# Patient Record
Sex: Female | Born: 1995 | Hispanic: Yes | Marital: Single | Smoking: Never smoker
Health system: Southern US, Community
[De-identification: ages and names within clinical notes are randomized; demographics above are authoritative.]

## PROBLEM LIST (undated history)

## (undated) DIAGNOSIS — Z789 Other specified health status: Secondary | ICD-10-CM

## (undated) DIAGNOSIS — R519 Headache, unspecified: Secondary | ICD-10-CM

## (undated) HISTORY — DX: Other specified health status: Z78.9

## (undated) HISTORY — PX: NO PAST SURGERIES: SHX2092

## (undated) HISTORY — DX: Headache, unspecified: R51.9

---

## 2021-05-23 ENCOUNTER — Other Ambulatory Visit: Payer: Self-pay

## 2021-05-23 ENCOUNTER — Ambulatory Visit (LOCAL_COMMUNITY_HEALTH_CENTER): Payer: Self-pay

## 2021-05-23 VITALS — BP 111/69 | Ht 65.75 in | Wt 162.5 lb

## 2021-05-23 DIAGNOSIS — Z3201 Encounter for pregnancy test, result positive: Secondary | ICD-10-CM

## 2021-05-23 LAB — PREGNANCY, URINE: Preg Test, Ur: POSITIVE — AB

## 2021-05-23 MED ORDER — PRENATAL 27-0.8 MG PO TABS: 1.0000 | ORAL_TABLET | Freq: Every day | ORAL | 0 refills | Status: DC

## 2021-05-23 NOTE — Progress Notes (Signed)
UPT positive. Recently moved from [place]. Plans prenatal care at [facility]. To clerk for preadmit. Juliene Pina, interpreter. Jerel Shepherd, RN

## 2021-06-04 NOTE — Progress Notes (Signed)
OB abstraction completed per phone interview and assistance form language line interpreter Santina Evans id [phone].  Pt plans to keep new OB appt 06-05-21 8 am.  Cherlynn Polo, RN

## 2021-06-05 ENCOUNTER — Encounter: Payer: Self-pay | Admitting: Physician Assistant

## 2021-06-05 ENCOUNTER — Ambulatory Visit: Payer: Medicaid Other | Admitting: Physician Assistant

## 2021-06-05 ENCOUNTER — Other Ambulatory Visit: Payer: Self-pay

## 2021-06-05 VITALS — BP 103/54 | HR 85 | Wt 161.0 lb

## 2021-06-05 DIAGNOSIS — Z3481 Encounter for supervision of other normal pregnancy, first trimester: Secondary | ICD-10-CM | POA: Diagnosis not present

## 2021-06-05 DIAGNOSIS — Z23 Encounter for immunization: Secondary | ICD-10-CM | POA: Diagnosis not present

## 2021-06-05 DIAGNOSIS — Z3480 Encounter for supervision of other normal pregnancy, unspecified trimester: Secondary | ICD-10-CM | POA: Insufficient documentation

## 2021-06-05 LAB — URINALYSIS
Bilirubin, UA: NEGATIVE
Glucose, UA: NEGATIVE
Ketones, UA: NEGATIVE
Leukocytes,UA: NEGATIVE
Nitrite, UA: NEGATIVE
Protein,UA: NEGATIVE
RBC, UA: NEGATIVE
Specific Gravity, UA: 1.015 (ref 1.005–1.030)
Urobilinogen, Ur: 0.2 mg/dL (ref 0.2–1.0)
pH, UA: 7 (ref 5.0–7.5)

## 2021-06-05 LAB — HEMOGLOBIN, FINGERSTICK: Hemoglobin: 13.3 g/dL (ref 11.1–15.9)

## 2021-06-05 NOTE — Progress Notes (Signed)
[facility]  Maternal Health Clinic   INITIAL PRENATAL VISIT NOTE  Subjective:  Samantha Brennan is a 26 y.o. G2P1001 at [redacted]w[redacted]d being seen today to start prenatal care at the [facility].  She is currently monitored for the following issues for this low-risk pregnancy and does not have a problem list on file.  Patient reports nausea.  Contractions: Not present. Vag. Bleeding: None.  Movement: Present. Denies leaking of fluid. Reports eating and drinking well.  Indications for ASA therapy (per uptodate) One of the following: Previous pregnancy with preeclampsia, especially early onset and with an adverse outcome No Multifetal gestation No Chronic hypertension No Type 1 or 2 diabetes mellitus No Chronic kidney disease No Autoimmune disease (antiphospholipid syndrome, systemic lupus erythematosus) No  Two or more of the following: Nulliparity No Obesity (body mass index >30 kg/m2) No Family history of preeclampsia in mother or sister No Age =35 years No Sociodemographic characteristics (African American race, low socioeconomic level) Yes Personal risk factors (eg, previous pregnancy with low birth weight or small for gestational age infant, previous adverse pregnancy outcome [eg, stillbirth], interval >10 years between pregnancies) No  The following portions of the patient's history were reviewed and updated as appropriate: allergies, current medications, past family history, past medical history, past social history, past surgical history and problem list. Problem list updated.  Objective:   Vitals:   06/05/21 0842  BP: (!) 103/54  Pulse: 85  Weight: 161 lb (73 kg)    Fetal Status: Fetal Heart Rate (bpm): 164 Fundal Height: 12 cm Movement: Present  Presentation: Undeterminable  Physical Exam Vitals and nursing note reviewed.  Constitutional:      General: She is not in acute distress.    Appearance: Normal appearance. She is  well-developed.  HENT:     Head: Normocephalic and atraumatic.     Right Ear: External ear normal.     Left Ear: External ear normal.     Nose: Nose normal. No congestion or rhinorrhea.     Mouth/Throat:     Lips: Pink.     Mouth: Mucous membranes are moist.     Dentition: Normal dentition. No dental caries.     Pharynx: Oropharynx is clear. Uvula midline.     Comments: Dentition: good dentition Eyes:     General: No scleral icterus.    Conjunctiva/sclera: Conjunctivae normal.  Neck:     Thyroid: No thyroid mass or thyromegaly.  Cardiovascular:     Rate and Rhythm: Normal rate.     Pulses: Normal pulses.     Comments: Extremities are warm and well perfused Pulmonary:     Effort: Pulmonary effort is normal.     Breath sounds: Normal breath sounds.  Chest:     Chest wall: No mass.  Breasts:    Tanner Score is 5.     Breasts are symmetrical.     Right: Normal. No mass, nipple discharge or skin change.     Left: Normal. No mass, nipple discharge or skin change.  Abdominal:     General: Abdomen is flat.     Palpations: Abdomen is soft.     Tenderness: There is no abdominal tenderness.     Comments: Gravid   Genitourinary:    General: Normal vulva.     Exam position: Lithotomy position.     Pubic Area: No rash.      Labia:        Right: No rash.  Left: No rash.      Vagina: Normal. No vaginal discharge.     Cervix: No cervical motion tenderness or friability.     Uterus: Normal. Enlarged (Gravid 10-12 wk size). Not tender.      Adnexa: Right adnexa normal and left adnexa normal.     Rectum: Normal. No external hemorrhoid.  Musculoskeletal:     Right lower leg: No edema.     Left lower leg: No edema.  Lymphadenopathy:     Cervical: No cervical adenopathy.     Upper Body:     Right upper body: No axillary adenopathy.     Left upper body: No axillary adenopathy.  Skin:    General: Skin is warm.     Capillary Refill: Capillary refill takes less than 2 seconds.   Neurological:     Mental Status: She is alert.    Assessment and Plan:  Pregnancy: G2P1001 at [redacted]w[redacted]d  1. Supervision of other normal pregnancy, antepartum Having some nausea, but eating and drinking well. Declines nausea medication. Written info re: supportive measures given. Pt desires first trim screen - will schedule. - IGP, rfx Aptima HPV ASCU - Urinalysis - Hemoglobin, fingerstick - Flu Vaccine QUAD 70mo+IM (Fluarix, Fluzone & Alfiuria Quad PF) - HIV-1/HIV-2 Qualitative RNA - Prenatal Profile with Varicella(282020) - HCV Ab w Reflex to Quant PCR - Urine Culture - Chlamydia/GC NAA, Confirmation - Lead, blood (adult age 74 yrs or greater) - Hemoglobinopathy evaluation -001749 - QuantiFERON-TB Gold Plus  Discussed overview of care and coordination with inpatient delivery practices including [facility], [facility], [facility] and [facility].   Preterm labor symptoms and general obstetric precautions including but not limited to vaginal bleeding, contractions, leaking of fluid and fetal movement were reviewed in detail with the patient. Due to language barrier, an interpreter (M. Yemen) was present during the provider portion of the visit with this patient.  Please refer to After Visit Summary for other counseling recommendations.   Return in about 4 weeks (around 07/03/2021) for Routine prenatal care.  No future appointments.  Landry Dyke, PA-C

## 2021-06-05 NOTE — Progress Notes (Signed)
First tri screen/US referral faxed 2/16 with confirmation.   Hgb reviewed during clinic visit and urine dip reviewed - no treatment indicated.   Floy Sabina, RN

## 2021-06-07 LAB — LEAD, BLOOD (ADULT >= 16 YRS): Lead-Whole Blood: <1 ug/dL (ref 0.0–3.4)

## 2021-06-08 LAB — URINE CULTURE

## 2021-06-09 LAB — CBC/D/PLT+RPR+RH+ABO+RUB AB...
Antibody Screen: NEGATIVE
Basophils Absolute: 0.1 10*3/uL (ref 0.0–0.2)
Basos: 1 %
EOS (ABSOLUTE): 0.1 10*3/uL (ref 0.0–0.4)
Eos: 1 %
Hematocrit: 38.2 % (ref 34.0–46.6)
Hemoglobin: 13 g/dL (ref 11.1–15.9)
Hepatitis B Surface Ag: NEGATIVE
Immature Grans (Abs): 0 10*3/uL (ref 0.0–0.1)
Immature Granulocytes: 0 %
Lymphocytes Absolute: 1.7 10*3/uL (ref 0.7–3.1)
Lymphs: 20 %
MCH: 27.8 pg (ref 26.6–33.0)
MCHC: 34 g/dL (ref 31.5–35.7)
MCV: 82 fL (ref 79–97)
Monocytes Absolute: 0.5 10*3/uL (ref 0.1–0.9)
Monocytes: 6 %
Neutrophils Absolute: 6.1 10*3/uL (ref 1.4–7.0)
Neutrophils: 72 %
Platelets: 346 10*3/uL (ref 150–450)
RBC: 4.67 x10E6/uL (ref 3.77–5.28)
RDW: 12.8 % (ref 11.7–15.4)
RPR Ser Ql: NONREACTIVE
Rh Factor: POSITIVE
Rubella Antibodies, IGG: 11.2 index (ref >0.99)
Varicella zoster IgG: 232 index (ref >165)
WBC: 8.5 10*3/uL (ref 3.4–10.8)

## 2021-06-09 LAB — HCV INTERPRETATION

## 2021-06-09 LAB — CHLAMYDIA/GC NAA, CONFIRMATION
Chlamydia trachomatis, NAA: NEGATIVE
Neisseria gonorrhoeae, NAA: NEGATIVE

## 2021-06-09 LAB — HCV AB W REFLEX TO QUANT PCR: HCV Ab: NONREACTIVE

## 2021-06-09 LAB — HGB FRACTIONATION CASCADE
Hgb A2: 2.3 % (ref 1.8–3.2)
Hgb A: 97.7 % (ref 96.4–98.8)
Hgb F: 0 % (ref 0.0–2.0)
Hgb S: 0 %

## 2021-06-09 LAB — QUANTIFERON-TB GOLD PLUS
QuantiFERON Mitogen Value: 8.97 IU/mL
QuantiFERON Nil Value: 0.02 IU/mL
QuantiFERON TB1 Ag Value: 0.01 IU/mL
QuantiFERON TB2 Ag Value: 0 IU/mL
QuantiFERON-TB Gold Plus: NEGATIVE

## 2021-06-09 LAB — HIV-1/HIV-2 QUALITATIVE RNA
HIV-1 RNA, Qualitative: NONREACTIVE
HIV-2 RNA, Qualitative: NONREACTIVE

## 2021-06-10 ENCOUNTER — Telehealth: Payer: Self-pay

## 2021-06-10 ENCOUNTER — Other Ambulatory Visit: Payer: Self-pay | Admitting: Advanced Practice Midwife

## 2021-06-10 DIAGNOSIS — Z3480 Encounter for supervision of other normal pregnancy, unspecified trimester: Secondary | ICD-10-CM

## 2021-06-10 LAB — IGP, RFX APTIMA HPV ASCU: PAP Smear Comment: 0

## 2021-06-10 NOTE — Telephone Encounter (Signed)
Call to client with Samantha Brennan and notified her of [facility]  first trimester screen appt ([place] / lab) on 06/23/2021  at 1030. Client provided name / address of facility and correctly verbalized it back. [facility] in [place] unable to schedule appt for client in needed time frame. Jossie Ng, RN

## 2021-06-23 ENCOUNTER — Encounter: Payer: Self-pay | Admitting: Advanced Practice Midwife

## 2021-06-23 ENCOUNTER — Ambulatory Visit: Payer: Self-pay | Admitting: *Deleted

## 2021-06-23 ENCOUNTER — Other Ambulatory Visit: Payer: Self-pay

## 2021-06-23 ENCOUNTER — Ambulatory Visit: Payer: Self-pay

## 2021-06-23 ENCOUNTER — Ambulatory Visit: Payer: Medicaid Other | Attending: Advanced Practice Midwife

## 2021-06-23 VITALS — BP 131/64 | HR 78 | Wt 162.0 lb

## 2021-06-23 DIAGNOSIS — Z369 Encounter for antenatal screening, unspecified: Secondary | ICD-10-CM | POA: Insufficient documentation

## 2021-06-23 DIAGNOSIS — Z3480 Encounter for supervision of other normal pregnancy, unspecified trimester: Secondary | ICD-10-CM

## 2021-06-23 DIAGNOSIS — Z3682 Encounter for antenatal screening for nuchal translucency: Secondary | ICD-10-CM

## 2021-06-23 DIAGNOSIS — Z3A13 13 weeks gestation of pregnancy: Secondary | ICD-10-CM | POA: Insufficient documentation

## 2021-06-28 LAB — MATERNIT21 PLUS CORE+SCA
Fetal Fraction: 7
Monosomy X (Turner Syndrome): NOT DETECTED
Result (T21): NEGATIVE
Trisomy 13 (Patau syndrome): NEGATIVE
Trisomy 18 (Edwards syndrome): NEGATIVE
Trisomy 21 (Down syndrome): NEGATIVE
XXX (Triple X Syndrome): NOT DETECTED
XXY (Klinefelter Syndrome): NOT DETECTED
XYY (Jacobs Syndrome): NOT DETECTED

## 2021-07-01 ENCOUNTER — Telehealth: Payer: Self-pay | Admitting: Obstetrics and Gynecology

## 2021-07-01 ENCOUNTER — Telehealth: Payer: Self-pay

## 2021-07-01 NOTE — Telephone Encounter (Signed)
The patient was informed of the results of her recent MaterniT21 testing which yielded NEGATIVE results.  The patient's specimen showed DNA consistent with two copies of chromosomes 21, 18 and 13.  The sensitivity for trisomy 70, trisomy 34 and trisomy 39 using this testing are reported as 99.1%, 99.9% and 91.7% respectively.  Thus, while the results of this testing are highly accurate, they are not considered diagnostic at this time.  Should more definitive information be desired, the patient may still consider amniocentesis.  ? ?As requested to know by the patient, sex chromosome analysis was included for this sample.  Results are consistent with a female fetus. This is predicted with >99% accuracy. This testing also screens for sex chromosome conditions with greater than 96% accuracy and was negative for those conditions.  ? ?A maternal serum AFP only should be considered if screening for neural tube defects is desired in the second trimester. ? ?The patient expressed that she desires her anatomy ultrasound to be in [place].  I will ask for that to be scheduled at 19 weeks and for our clinic to contact her with the date and time. ? ?We may be reached at [phone] with any questions or concerns. ? ?Cherly Anderson, MS, CGC ? ?

## 2021-07-01 NOTE — Telephone Encounter (Signed)
Received staff message from Diannia Ruder RN requesting we notify client of upcoming [place] appt at her [facility] RV appt. Call to client with [facility] and client notified of 08/05/21 appt with arrival time of 1445. Rich Number, RN ? ? ?

## 2021-07-03 ENCOUNTER — Ambulatory Visit: Payer: Medicaid Other | Admitting: Advanced Practice Midwife

## 2021-07-03 ENCOUNTER — Other Ambulatory Visit: Payer: Self-pay

## 2021-07-03 VITALS — BP 114/64 | HR 87 | Temp 98.3°F | Wt 161.0 lb

## 2021-07-03 DIAGNOSIS — Z3482 Encounter for supervision of other normal pregnancy, second trimester: Secondary | ICD-10-CM

## 2021-07-03 NOTE — Progress Notes (Signed)
[facility] Department ?Maternal Health Clinic ? ?PRENATAL VISIT NOTE ? ?Subjective:  ?Samantha Brennan is a 26 y.o. G2P1001 at [redacted]w[redacted]d being seen today for ongoing prenatal care.  She is currently monitored for the following issues for this low-risk pregnancy and has Supervision of other normal pregnancy, antepartum on their problem list. ? ?Patient reports  her 2 yo son's pediatrician in [place] told her she needs to get her son evaluated for autism 3 mo ago .  Contractions: Not present. Vag. Bleeding: None.  Movement: Absent. Denies leaking of fluid/ROM.  ? ?The following portions of the patient's history were reviewed and updated as appropriate: allergies, current medications, past family history, past medical history, past social history, past surgical history and problem list. Problem list updated. ? ?Objective:  ? ?Vitals:  ? 07/03/21 0830  ?BP: 114/64  ?Pulse: 87  ?Temp: 98.3 ?F (36.8 ?C)  ?Weight: 161 lb (73 kg)  ? ? ?Fetal Status: Fetal Heart Rate (bpm): 150 Fundal Height: 16 cm Movement: Absent    ? ?General:  Alert, oriented and cooperative. Patient is in no acute distress.  ?Skin: Skin is warm and dry. No rash noted.   ?Cardiovascular: Normal heart rate noted  ?Respiratory: Normal respiratory effort, no problems with respiration noted  ?Abdomen: Soft, gravid, appropriate for gestational age.  Pain/Pressure: Absent     ?Pelvic: Cervical exam deferred        ?Extremities: Normal range of motion.  Edema: None  ?Mental Status: Normal mood and affect. Normal behavior. Normal judgment and thought content.  ? ?Assessment and Plan:  ?Pregnancy: G2P1001 at [redacted]w[redacted]d ? ?1. Supervision of other normal pregnancy, antepartum ?FIRST screen neg on 06/23/21 ?AFP only today ?Not working. Living with a lady and her 2 kids and pts' 2 yo son ?Not exercising--counseled to do so 3x/wk x 20 min ?6 lb (2.722 kg) ?N&V resolved ?Has u/s 08/05/21 ?Encouraged pt to schedule well child apt here for her son in order to get  referral for autism evaluation ?- AFP, Serum, Open Spina Bifida ? ? ?Preterm labor symptoms and general obstetric precautions including but not limited to vaginal bleeding, contractions, leaking of fluid and fetal movement were reviewed in detail with the patient. ?Please refer to After Visit Summary for other counseling recommendations.  ?No follow-ups on file. ? ?Future Appointments  ?Date Time Provider Department Center  ?08/05/2021  3:00 PM [facility]  ? ? ?Alberteen Spindle, CNM ? ?

## 2021-07-03 NOTE — Progress Notes (Signed)
Patient here for MH RV at 15 1/7. Desires AFP only today. States she is concerned about her 27 year old son, and thinks he may be autistic (based on discussion with her son's previous pediatrician). She is trying to have his Medicaid terminated in [place] so she can apply for Medicaid here. States she has peds list. TC to A. Earl Gala who will follow-up with patient by phone.Burt Knack, RN  ?

## 2021-07-05 LAB — AFP, SERUM, OPEN SPINA BIFIDA
AFP MoM: 0.81
AFP Value: 23.7 ng/mL
Gest. Age on Collection Date: 15.1 weeks
Maternal Age At EDD: 27.1 yr
OSBR Risk 1 IN: 10000
Test Results:: NEGATIVE
Weight: 161 [lb_av]

## 2021-07-31 ENCOUNTER — Ambulatory Visit: Payer: Self-pay | Admitting: Physician Assistant

## 2021-07-31 ENCOUNTER — Encounter: Payer: Self-pay | Admitting: Physician Assistant

## 2021-07-31 DIAGNOSIS — Z3480 Encounter for supervision of other normal pregnancy, unspecified trimester: Secondary | ICD-10-CM

## 2021-07-31 DIAGNOSIS — Z3482 Encounter for supervision of other normal pregnancy, second trimester: Secondary | ICD-10-CM

## 2021-07-31 NOTE — Progress Notes (Signed)
Aware of 08/05/2021 [facility] [place] appt and reminder card given. Client aware she will receive bill for [place]. Remains undecided as to pediatrician and [facility] post-partum - verified has previously given resources. Jossie Ng, RN ? ? ?

## 2021-07-31 NOTE — Progress Notes (Signed)
[facility] Department ?Maternal Health Clinic ? ?PRENATAL VISIT NOTE ? ?Subjective:  ?Samantha Brennan is a 26 y.o. G2P1001 at [redacted]w[redacted]d being seen today for ongoing prenatal care.  She is currently monitored for the following issues for this low-risk pregnancy and has Supervision of other normal pregnancy, antepartum on their problem list. ? ?Patient reports no complaints.  Contractions: Not present. Vag. Bleeding: None.  Movement: Present. Denies leaking of fluid/ROM.  ? ?The following portions of the patient's history were reviewed and updated as appropriate: allergies, current medications, past family history, past medical history, past social history, past surgical history and problem list. Problem list updated. ? ?Objective:  ? ?Vitals:  ? 07/31/21 0818  ?BP: 104/68  ?Pulse: 85  ?Temp: 97.9 ?F (36.6 ?C)  ?Weight: 162 lb 6.4 oz (73.7 kg)  ? ? ?Fetal Status: Fetal Heart Rate (bpm): 145 Fundal Height: 20 cm Movement: Present    ? ?General:  Alert, oriented and cooperative. Patient is in no acute distress.  ?Skin: Skin is warm and dry. No rash noted.   ?Cardiovascular: Normal heart rate noted  ?Respiratory: Normal respiratory effort, no problems with respiration noted  ?Abdomen: Soft, gravid, appropriate for gestational age.  Pain/Pressure: Absent     ?Pelvic: Cervical exam deferred        ?Extremities: Normal range of motion.  Edema: None  ?Mental Status: Normal mood and affect. Normal behavior. Normal judgment and thought content.  ? ?Assessment and Plan:  ?Pregnancy: G2P1001 at [redacted]w[redacted]d ? ?1. Supervision of other normal pregnancy, antepartum ?Doing well, continue current care. Enc to keep fetal anat [place] as sched next week. ? ? ?Preterm labor symptoms and general obstetric precautions including but not limited to vaginal bleeding, contractions, leaking of fluid and fetal movement were reviewed in detail with the patient. ?Due to language barrier, an interpreter Juliene Pina) was present during the  provider portion of the visit with this patient. ? ?Please refer to After Visit Summary for other counseling recommendations.  ?Return in about 4 weeks (around 08/28/2021) for Routine prenatal care. ? ?Future Appointments  ?Date Time Provider Department Center  ?08/05/2021  3:00 PM [facility]  ? ? ?Landry Dyke, PA-C ? ?

## 2021-08-01 ENCOUNTER — Encounter: Payer: Self-pay | Admitting: Advanced Practice Midwife

## 2021-08-05 ENCOUNTER — Other Ambulatory Visit: Payer: Self-pay

## 2021-08-05 ENCOUNTER — Ambulatory Visit: Payer: Self-pay | Attending: Obstetrics and Gynecology

## 2021-08-05 DIAGNOSIS — Z3689 Encounter for other specified antenatal screening: Secondary | ICD-10-CM | POA: Insufficient documentation

## 2021-08-05 DIAGNOSIS — Z3A19 19 weeks gestation of pregnancy: Secondary | ICD-10-CM | POA: Insufficient documentation

## 2021-08-05 DIAGNOSIS — Z363 Encounter for antenatal screening for malformations: Secondary | ICD-10-CM | POA: Insufficient documentation

## 2021-08-05 DIAGNOSIS — Z3480 Encounter for supervision of other normal pregnancy, unspecified trimester: Secondary | ICD-10-CM

## 2021-08-06 ENCOUNTER — Encounter: Payer: Self-pay | Admitting: Advanced Practice Midwife

## 2021-08-28 ENCOUNTER — Encounter: Payer: Self-pay | Admitting: Physician Assistant

## 2021-08-28 ENCOUNTER — Ambulatory Visit: Payer: Self-pay | Admitting: Physician Assistant

## 2021-08-28 VITALS — BP 103/67 | HR 79 | Temp 97.3°F | Wt 162.8 lb

## 2021-08-28 DIAGNOSIS — Z3401 Encounter for supervision of normal first pregnancy, first trimester: Secondary | ICD-10-CM

## 2021-08-28 DIAGNOSIS — Z3480 Encounter for supervision of other normal pregnancy, unspecified trimester: Secondary | ICD-10-CM

## 2021-08-28 DIAGNOSIS — Z3482 Encounter for supervision of other normal pregnancy, second trimester: Secondary | ICD-10-CM

## 2021-08-28 MED ORDER — PRENATAL VITAMIN 27-0.8 MG PO TABS: 1.0000 | ORAL_TABLET | Freq: Every day | ORAL | 0 refills | Status: AC

## 2021-08-28 NOTE — Progress Notes (Signed)
Patient here for MH RV at 23 1/7. CMHRP and PHQ9 today, S/S PTL reviewed and literature given. More PNV given today.Burt Knack, RN  ?

## 2021-08-28 NOTE — Progress Notes (Signed)
[facility] Department ?Maternal Health Clinic ? ?PRENATAL VISIT NOTE ? ?Subjective:  ?Samantha Brennan is a 26 y.o. G2P1001 at [redacted]w[redacted]d being seen today for ongoing prenatal care.  She is currently monitored for the following issues for this low-risk pregnancy and has Supervision of other normal pregnancy, antepartum on their problem list. ? ?Patient reports  1 episode of vomiting and red rash on face after eating shrimp 15 days ago. Rash lasted 1 day. Not associated with itching, lip/mouth swelling, SOB, wheezing. No h/o prior reaction/allergy to shrimp .  Contractions: Not present. Vag. Bleeding: None.  Movement: Present. Kept anat [place] appt and was told she needed f/u appt to f/u baby's growth.Denies leaking of fluid/ROM.  ? ?The following portions of the patient's history were reviewed and updated as appropriate: allergies, current medications, past family history, past medical history, past social history, past surgical history and problem list. Problem list updated. ? ?Objective:  ? ?Vitals:  ? 08/28/21 0830  ?BP: 103/67  ?Pulse: 79  ?Temp: (!) 97.3 ?F (36.3 ?C)  ?Weight: 162 lb 12.8 oz (73.8 kg)  ? ? ?Fetal Status: Fetal Heart Rate (bpm): 134 Fundal Height: 23 cm Movement: Present    ? ?General:  Alert, oriented and cooperative. Patient is in no acute distress.  ?Skin: Skin is warm and dry. No rash noted.   ?Cardiovascular: Normal heart rate noted  ?Respiratory: Normal respiratory effort, no problems with respiration noted  ?Abdomen: Soft, gravid, appropriate for gestational age.  Pain/Pressure: Absent     ?Pelvic: Cervical exam deferred        ?Extremities: Normal range of motion.  Edema: None  ?Mental Status: Normal mood and affect. Normal behavior. Normal judgment and thought content.  ?PHQ-9 score = 0 ?Assessment and Plan:  ?Pregnancy: G2P1001 at [redacted]w[redacted]d ? ?1. Encounter for supervision of normal first pregnancy in first trimester ?Doing well. Reviewed 08/05/21 anat [place] at 19 3/7 with post  placenta, VC, 3VV and 3VTV visualized, EFW 16%. 8w f/u US or fetal growth made by [facility]. I enc pt to keep this appt. Anticipatory guidance re: routine labs at next visit. Though doubt allergic reaction, enc pt to avoid shrimp throughout remainder of preg, then try small amt and watch for reaction. If swelling, hives, SOB/wheezing, take Benadryl & seek emergent care, if no reaction, try larger shrimp meal and watch for reaction. ?- Prenatal Vit-Fe Fumarate-FA (PRENATAL VITAMIN) 27-0.8 MG TABS; Take 1 tablet by mouth daily.  Dispense: 100 tablet; Refill: 0 ? ? ?Preterm labor symptoms and general obstetric precautions including but not limited to vaginal bleeding, contractions, leaking of fluid and fetal movement were reviewed in detail with the patient. ?Please refer to After Visit Summary for other counseling recommendations.  ?Due to language barrier, an interpreter Fenton Malling, [phone], [employer]) was used during the history-taking and subsequent discussion (and for part of the physical exam) with this patient. ? ?Return in about 4 weeks (around 09/25/2021) for Routine prenatal care, 28 wk labs (before 10:30am or 3pm). ? ?Future Appointments  ?Date Time Provider Department Center  ?09/30/2021  9:00 AM [facility]  ? ? ?Landry Dyke, PA-C ? ?

## 2021-09-23 ENCOUNTER — Other Ambulatory Visit: Payer: Self-pay

## 2021-09-23 DIAGNOSIS — Z3403 Encounter for supervision of normal first pregnancy, third trimester: Secondary | ICD-10-CM

## 2021-09-25 ENCOUNTER — Other Ambulatory Visit: Payer: Self-pay

## 2021-09-25 ENCOUNTER — Encounter: Payer: Self-pay | Admitting: Physician Assistant

## 2021-09-25 ENCOUNTER — Ambulatory Visit: Payer: Self-pay | Admitting: Physician Assistant

## 2021-09-25 VITALS — BP 95/64 | HR 83 | Temp 97.2°F | Wt 164.6 lb

## 2021-09-25 DIAGNOSIS — R7309 Other abnormal glucose: Secondary | ICD-10-CM | POA: Insufficient documentation

## 2021-09-25 DIAGNOSIS — Z3480 Encounter for supervision of other normal pregnancy, unspecified trimester: Secondary | ICD-10-CM

## 2021-09-25 LAB — HEMOGLOBIN, FINGERSTICK: Hemoglobin: 11.5 g/dL (ref 11.1–15.9)

## 2021-09-25 NOTE — Progress Notes (Signed)
Aware of [facility] [place] appt on 09/30/2021. 28 week labs today. Samantha Ng, RN Counseled on recommendation for Tdap in pregnancy and for those who will spend any amt of time with infant. Client tolerated Tdap today without complaint. Samantha Ng, RN

## 2021-09-25 NOTE — Progress Notes (Signed)
Hgb reviewed - no treatment indicated.   Peggie Hornak Ramos, RN  

## 2021-09-25 NOTE — Progress Notes (Signed)
[facility] Maternal Health Clinic  PRENATAL VISIT NOTE  Subjective:  Samantha Brennan is a 26 y.o. G2P1001 at [redacted]w[redacted]d being seen today for ongoing prenatal care.  She is currently monitored for the following issues for this low-risk pregnancy and has Supervision of other normal pregnancy, antepartum on their problem list.  Patient reports no complaints.  Contractions: Not present. Vag. Bleeding: None.  Movement: Present. Denies leaking of fluid/ROM.   The following portions of the patient's history were reviewed and updated as appropriate: allergies, current medications, past family history, past medical history, past social history, past surgical history and problem list. Problem list updated.  Objective:   Vitals:   09/25/21 0818  BP: 95/64  Pulse: 83  Temp: (!) 97.2 F (36.2 C)  Weight: 164 lb 9.6 oz (74.7 kg)    Fetal Status: Fetal Heart Rate (bpm): 144 Fundal Height: 28 cm Movement: Present     General:  Alert, oriented and cooperative. Patient is in no acute distress.  Skin: Skin is warm and dry. No rash noted.   Cardiovascular: Normal heart rate noted  Respiratory: Normal respiratory effort, no problems with respiration noted  Abdomen: Soft, gravid, appropriate for gestational age.  Pain/Pressure: Absent     Pelvic: Cervical exam deferred        Extremities: Normal range of motion.  Edema: None  Mental Status: Normal mood and affect. Normal behavior. Normal judgment and thought content.   Assessment and Plan:  Pregnancy: G2P1001 at [redacted]w[redacted]d  1. Supervision of other normal pregnancy, antepartum Enc to keep fetal growth [place] as sched 09/30/21. Due to poor air quality/smoke from [employer], enc to avoid being outside, and to wear facemask if outside over next several days. Routine 28-wk labs today. Anticipatory guidance re: transition to q 2-wk visits. - Hemoglobin, venipuncture - Glucose, 1 hour - HIV-1/HIV-2 Qualitative RNA -  RPR   Preterm labor symptoms and general obstetric precautions including but not limited to vaginal bleeding, contractions, leaking of fluid and fetal movement were reviewed in detail with the patient. Due to language barrier, an interpreter (M. [place]) was present during the history-taking and subsequent discussion (and the physical exam) with this patient.  Please refer to After Visit Summary for other counseling recommendations.  Return in about 2 weeks (around 10/09/2021) for Routine prenatal care.  Future Appointments  Date Time Provider Department Center  09/30/2021  9:00 AM [facility]    Landry Dyke, PA-C

## 2021-09-27 LAB — GLUCOSE, 1 HOUR GESTATIONAL: Gestational Diabetes Screen: 177 mg/dL — ABNORMAL HIGH (ref 70–139)

## 2021-09-27 LAB — RPR: RPR Ser Ql: NONREACTIVE

## 2021-09-27 LAB — HIV-1/HIV-2 QUALITATIVE RNA
HIV-1 RNA, Qualitative: NONREACTIVE
HIV-2 RNA, Qualitative: NONREACTIVE

## 2021-09-29 ENCOUNTER — Telehealth: Payer: Self-pay

## 2021-09-29 NOTE — Telephone Encounter (Signed)
Telephone call to patient regarding her abnormal 1 HR GTT.  Patient needs a 3 HR GTT.  Patient scheduled for 10/02/21 at 8:50 am (arrival time 8:15 am).  Patient aware to be NPO after midnight, no burning calories and will be here until about lunch time.  Language Line used today.  Jennet Maduro / [id number].  Hart Carwin, RN

## 2021-09-30 ENCOUNTER — Other Ambulatory Visit: Payer: Self-pay

## 2021-09-30 ENCOUNTER — Ambulatory Visit: Payer: Self-pay | Attending: Maternal & Fetal Medicine

## 2021-09-30 DIAGNOSIS — Z362 Encounter for other antenatal screening follow-up: Secondary | ICD-10-CM | POA: Insufficient documentation

## 2021-09-30 DIAGNOSIS — Z3A27 27 weeks gestation of pregnancy: Secondary | ICD-10-CM | POA: Insufficient documentation

## 2021-09-30 DIAGNOSIS — Z3403 Encounter for supervision of normal first pregnancy, third trimester: Secondary | ICD-10-CM

## 2021-10-02 ENCOUNTER — Other Ambulatory Visit: Payer: Self-pay

## 2021-10-02 DIAGNOSIS — R7309 Other abnormal glucose: Secondary | ICD-10-CM

## 2021-10-02 NOTE — Progress Notes (Signed)
In Nurse Clinic for 3 hr GTT. Samantha Brennan, interpreter. Instructions reviewed for test today. Pt reports npo since 11:30 pm last night. Advised to notify RN if nausea/vomiting. Pt verbalizes understanding of instructions for test today. Jerel Shepherd, RN

## 2021-10-03 LAB — GLUCOSE TOLERANCE TEST, 6 HOUR
Glucose, 1 Hour GTT: 185 mg/dL (ref 70–199)
Glucose, 2 hour: 163 mg/dL — ABNORMAL HIGH (ref 70–139)
Glucose, 3 hour: 115 mg/dL — ABNORMAL HIGH (ref 70–109)
Glucose, GTT - Fasting: 93 mg/dL (ref 70–99)

## 2021-10-07 ENCOUNTER — Telehealth: Payer: Self-pay | Admitting: Advanced Practice Midwife

## 2021-10-07 ENCOUNTER — Encounter: Payer: Self-pay | Admitting: Advanced Practice Midwife

## 2021-10-07 ENCOUNTER — Other Ambulatory Visit: Payer: Self-pay | Admitting: Advanced Practice Midwife

## 2021-10-07 DIAGNOSIS — O24419 Gestational diabetes mellitus in pregnancy, unspecified control: Secondary | ICD-10-CM | POA: Insufficient documentation

## 2021-10-07 HISTORY — DX: Gestational diabetes mellitus in pregnancy, unspecified control: O24.419

## 2021-10-07 MED ORDER — LANCETS MISC: 1.0000 [IU] | Freq: Four times a day (QID) | 12 refills | Status: AC

## 2021-10-07 MED ORDER — BLOOD GLUCOSE TEST VI STRP: 1.0000 [IU] | ORAL_STRIP | Freq: Four times a day (QID) | 12 refills | Status: AC

## 2021-10-07 MED ORDER — BLOOD GLUCOSE MONITOR KIT: PACK | 0 refills | Status: AC

## 2021-10-07 NOTE — Telephone Encounter (Signed)
T/C to pt via interpreter Samantha Brennan to inform her of her new dx of gestational diabetes and implications for AP/IP/PP for self and baby. Informed she will need to pick up diabetic supplies I ordered and that she will be called for a [employer] apt where they will teach her all about diabetes during pregnancy, foods to eat and foods to avoid, exercise to help regulate blood sugars. Expectations set out for pt during this pregnancy. Questions answered. Pt has apt here 10/09/21

## 2021-10-09 ENCOUNTER — Encounter: Payer: Self-pay | Admitting: Physician Assistant

## 2021-10-09 ENCOUNTER — Ambulatory Visit: Payer: Self-pay | Admitting: Physician Assistant

## 2021-10-09 VITALS — BP 97/56 | HR 81 | Temp 97.1°F | Wt 164.8 lb

## 2021-10-09 DIAGNOSIS — Z3480 Encounter for supervision of other normal pregnancy, unspecified trimester: Secondary | ICD-10-CM

## 2021-10-09 DIAGNOSIS — O24419 Gestational diabetes mellitus in pregnancy, unspecified control: Secondary | ICD-10-CM

## 2021-10-09 LAB — URINALYSIS
Bilirubin, UA: NEGATIVE
Glucose, UA: NEGATIVE
Ketones, UA: NEGATIVE
Leukocytes,UA: NEGATIVE
Nitrite, UA: NEGATIVE
RBC, UA: NEGATIVE
Specific Gravity, UA: 1.025 (ref 1.005–1.030)
Urobilinogen, Ur: 1 mg/dL (ref 0.2–1.0)
pH, UA: 6.5 (ref 5.0–7.5)

## 2021-10-09 NOTE — Progress Notes (Signed)
Urinalysis reviewed during clinic visit - no treatment indicated.   Earlyne Iba, RN

## 2021-10-09 NOTE — Addendum Note (Signed)
Addended by: Heywood Bene on: 10/09/2021 02:21 PM   Modules accepted: Orders

## 2021-10-16 ENCOUNTER — Other Ambulatory Visit: Payer: Self-pay

## 2021-10-16 DIAGNOSIS — O24419 Gestational diabetes mellitus in pregnancy, unspecified control: Secondary | ICD-10-CM

## 2021-10-17 ENCOUNTER — Ambulatory Visit: Payer: Self-pay | Admitting: *Deleted

## 2021-10-17 ENCOUNTER — Encounter: Payer: Self-pay | Attending: Advanced Practice Midwife | Admitting: *Deleted

## 2021-10-17 ENCOUNTER — Encounter: Payer: Self-pay | Admitting: *Deleted

## 2021-10-17 VITALS — BP 100/60 | Ht 62.0 in | Wt 165.3 lb

## 2021-10-17 DIAGNOSIS — O24419 Gestational diabetes mellitus in pregnancy, unspecified control: Secondary | ICD-10-CM | POA: Insufficient documentation

## 2021-10-17 DIAGNOSIS — O24410 Gestational diabetes mellitus in pregnancy, diet controlled: Secondary | ICD-10-CM

## 2021-10-17 DIAGNOSIS — Z713 Dietary counseling and surveillance: Secondary | ICD-10-CM | POA: Insufficient documentation

## 2021-10-17 DIAGNOSIS — Z3A28 28 weeks gestation of pregnancy: Secondary | ICD-10-CM | POA: Insufficient documentation

## 2021-10-17 NOTE — Progress Notes (Signed)
Diabetes Self-Management Education  Visit Type: First/Initial  Appt. Start Time: 0810 Appt. End Time: 0940  10/17/2021  [name], identified by name and date of birth, is a 26 y.o. female with a diagnosis of Diabetes: Gestational Diabetes.   ASSESSMENT  Blood pressure 100/60, height 5\' 2"  (1.575 m), weight 165 lb 4.8 oz (75 kg), last menstrual period 03/19/2021, estimated date of delivery 12/24/2021 Body mass index is 30.23 kg/m.   Diabetes Self-Management Education - 10/17/21 0943       Visit Information   Visit Type First/Initial      Initial Visit   Diabetes Type Gestational Diabetes    Date Diagnosed 2 weeks    Are you currently following a meal plan? No    Are you taking your medications as prescribed? Yes      Health Coping   How would you rate your overall health? Good      Psychosocial Assessment   Patient Belief/Attitude about Diabetes Afraid   "scared"   What is the hardest part about your diabetes right now, causing you the most concern, or is the most worrisome to you about your diabetes?   Checking blood sugar    Self-care barriers English as a second language    Self-management support Doctor's office    Other persons present Interpreter    Patient Concerns Nutrition/Meal planning;Monitoring    Special Needs Other (comment)   Materials in Spanish   Preferred Learning Style Other (comment)   talking/discussion   Learning Readiness Ready    How often do you need to have someone help you when you read instructions, pamphlets, or other written materials from your doctor or pharmacy? 1 - Never   when written in Spanish   What is the last grade level you completed in school? high school      Pre-Education Assessment   Patient understands the diabetes disease and treatment process. Needs Instruction    Patient understands incorporating nutritional management into lifestyle. Needs Instruction    Patient undertands incorporating physical activity  into lifestyle. Needs Instruction    Patient understands using medications safely. Needs Instruction    Patient understands monitoring blood glucose, interpreting and using results Needs Instruction    Patient understands prevention, detection, and treatment of acute complications. Needs Instruction    Patient understands prevention, detection, and treatment of chronic complications. Needs Instruction    Patient understands how to develop strategies to address psychosocial issues. Needs Instruction    Patient understands how to develop strategies to promote health/change behavior. Needs Instruction      Complications   How often do you check your blood sugar? 0 times/day (not testing)   Pt brought her Accu-Chek Guide meter to appointment and was instructed on use. BG in the office was 91 mg/dL at 10/19/21 am - fasting.   Have you had a dilated eye exam in the past 12 months? No    Have you had a dental exam in the past 12 months? No    Are you checking your feet? No      Dietary Intake   Breakfast 11 am - left overs from lunch    Lunch 6 pm - chicken, beef, pork, fish; beans, rice, potatoes, tortillas, spaghetti, cauliflower, broccoli, salads with lettuce, tomatoes, cuccumbers, onions, peppers    Dinner 11:30 pm - snack of fruit (watermelon, mango, plums, pineapple, apple), crrackers    Beverage(s) water, coffee with sugar      Activity / Exercise  Activity / Exercise Type ADL's      Patient Education   Previous Diabetes Education No    Disease Pathophysiology Definition of diabetes, type 1 and 2, and the diagnosis of diabetes;Factors that contribute to the development of diabetes    Healthy Eating Role of diet in the treatment of diabetes and the relationship between the three main macronutrients and blood glucose level;Food label reading, portion sizes and measuring food.;Reviewed blood glucose goals for pre and post meals and how to evaluate the patients' food intake on their blood glucose  level.    Being Active Role of exercise on diabetes management, blood pressure control and cardiac health.    Medications Other (comment)   Limited use of oral medications during pregnancy and potential for insulin.   Monitoring Taught/evaluated SMBG meter.;Purpose and frequency of SMBG.;Taught/discussed recording of test results and interpretation of SMBG.;Ketone testing, when, how.    Chronic complications Relationship between chronic complications and blood glucose control    Diabetes Stress and Support Identified and addressed patients feelings and concerns about diabetes    Preconception care Pregnancy and GDM  Role of pre-pregnancy blood glucose control on the development of the fetus;Reviewed with patient blood glucose goals with pregnancy;Role of family planning for patients with diabetes      Individualized Goals (developed by patient)   Reducing Risk Other (comment)   Prevent diabetes complications     Outcomes   Expected Outcomes Demonstrated interest in learning. Expect positive outcomes    Program Status Not Completed         Individualized Plan for Diabetes Self-Management Training:   Learning Objective:  Patient will have a greater understanding of diabetes self-management. Patient education plan is to attend individual and/or group sessions per assessed needs and concerns.   Plan:   Read booklet on Gestational Diabetes Follow Gestational Meal Planning Guidelines Avoid sugar sweetened beverages (coffee) Include a snack between breakfast and lunch Include 1 serving of protein when eating fruit for a snack Check blood sugars 4 x day - before breakfast and 2 hrs after every meal and record  Bring blood sugar log to all MD appointments Purchase urine ketone strips if instructed by MD and check urine ketones every am:  If + increase bedtime snack to 1 protein and 2 carbohydrate servings Walk 20-30 minutes at least 5 x week if permitted by MD  Expected Outcomes:   Demonstrated interest in learning. Expect positive outcomes  Education material provided:  Gestational Booklet (Spanish) Gestational Meal Planning Guidelines (Spanish) Simple Meal Plan (Spanish) Goals for a Healthy Pregnancy (Spanish)   If problems or questions, patient to contact team via:   Sharion Settler, RN, CCM, CDCES [phone]  Future DSME appointment:  The patient doesn't have insurance and doesn't want to return for the appointment with the dietitian due to cost.

## 2021-10-17 NOTE — Patient Instructions (Signed)
Read booklet on Gestational Diabetes Follow Gestational Meal Planning Guidelines Avoid sugar sweetened beverages (coffee) Include a snack between breakfast and lunch Include 1 serving of protein when eating fruit for a snack Check blood sugars 4 x day - before breakfast and 2 hrs after every meal and record  Bring blood sugar log to all MD appointments Purchase urine ketone strips if instructed by MD and check urine ketones every am:  If + increase bedtime snack to 1 protein and 2 carbohydrate servings Walk 20-30 minutes at least 5 x week if permitted by MD

## 2021-10-23 ENCOUNTER — Ambulatory Visit: Payer: Self-pay | Admitting: Physician Assistant

## 2021-10-23 ENCOUNTER — Ambulatory Visit: Payer: Self-pay | Attending: Obstetrics

## 2021-10-23 ENCOUNTER — Encounter: Payer: Self-pay | Admitting: Physician Assistant

## 2021-10-23 ENCOUNTER — Other Ambulatory Visit: Payer: Self-pay

## 2021-10-23 ENCOUNTER — Ambulatory Visit: Payer: Self-pay

## 2021-10-23 VITALS — BP 104/65 | HR 87 | Temp 97.5°F | Wt 165.8 lb

## 2021-10-23 DIAGNOSIS — O24419 Gestational diabetes mellitus in pregnancy, unspecified control: Secondary | ICD-10-CM | POA: Insufficient documentation

## 2021-10-23 DIAGNOSIS — O24410 Gestational diabetes mellitus in pregnancy, diet controlled: Secondary | ICD-10-CM

## 2021-10-23 DIAGNOSIS — Z3480 Encounter for supervision of other normal pregnancy, unspecified trimester: Secondary | ICD-10-CM

## 2021-10-23 DIAGNOSIS — Z3A31 31 weeks gestation of pregnancy: Secondary | ICD-10-CM | POA: Insufficient documentation

## 2021-10-23 LAB — URINALYSIS
Bilirubin, UA: NEGATIVE
Glucose, UA: NEGATIVE
Ketones, UA: NEGATIVE
Nitrite, UA: NEGATIVE
Protein,UA: NEGATIVE
RBC, UA: NEGATIVE
Specific Gravity, UA: 1.015 (ref 1.005–1.030)
Urobilinogen, Ur: 0.2 mg/dL (ref 0.2–1.0)
pH, UA: 7 (ref 5.0–7.5)

## 2021-10-23 NOTE — Progress Notes (Signed)
[facility]  PRENATAL VISIT NOTE  Subjective:  Samantha Brennan is a 26 y.o. G2P1001 at [redacted]w[redacted]d being seen today for ongoing prenatal care.  She is currently monitored for the following issues for this high-risk pregnancy and has Supervision of other normal pregnancy, antepartum and Gestational diabetes mellitus (GDM) dx'd 10/02/21 on their problem list.  Patient reports no complaints.  Contractions: Not present. Vag. Bleeding: None.  Movement: Present. Denies leaking of fluid/ROM.   The following portions of the patient's history were reviewed and updated as appropriate: allergies, current medications, past family history, past medical history, past social history, past surgical history and problem list. Problem list updated.  Objective:   Vitals:   10/23/21 1535  BP: 104/65  Pulse: 87  Temp: (!) 97.5 F (36.4 C)  Weight: 165 lb 12.8 oz (75.2 kg)    Fetal Status: Fetal Heart Rate (bpm): 144 Fundal Height: 30 cm Movement: Present     General:  Alert, oriented and cooperative. Patient is in no acute distress.  Skin: Skin is warm and dry. No rash noted.   Cardiovascular: Normal heart rate noted  Respiratory: Normal respiratory effort, no problems with respiration noted  Abdomen: Soft, gravid, appropriate for gestational age.  Pain/Pressure: Absent     Pelvic: Cervical exam deferred        Extremities: Normal range of motion.  Edema: None  Mental Status: Normal mood and affect. Normal behavior. Normal judgment and thought content.   Assessment and Plan:  Pregnancy: G2P1001 at [redacted]w[redacted]d  1. Supervision of other normal pregnancy, antepartum Feeling well. Enc to drink plenty of water to prevent dehydration due to very hot weather. - Urinalysis (Urine Dip)  2. Gestational diabetes mellitus (GDM) dx'd 10/02/21 Reviewed fetal growth [place] from earlier today: EFW 23%. Enc to keep f/u US as sched in 5 weeks. Reviewed first week of home BS log:  FBS 91-99 (most >95) and all 2h postprandial readings except 1 <120 (1 =120). Admits to some dietary indiscretions with baby shower cake over last several days. Enc to focus on diet (esp evening), walking, continue to monitor BS. Consider pm metformin if pm BS not in desired range at f/u appt. Pt states understanding.  Preterm labor symptoms and general obstetric precautions including but not limited to vaginal bleeding, contractions, leaking of fluid and fetal movement were reviewed in detail with the patient.  Please refer to After Visit Summary for other counseling recommendations.  Due to language barrier, an interpreter (M. [place]) was present during the history-taking and subsequent discussion (and for part of the physical exam) with this patient.  Return in about 1 week (around 10/30/2021) for Routine prenatal care.  Future Appointments  Date Time Provider Department Center  10/30/2021  8:20 AM [facility] PROVIDER [facility] None  11/27/2021  9:00 AM [facility]    Landry Dyke, PA-C

## 2021-10-23 NOTE — Progress Notes (Signed)
[facility] Spanish Interpretor Roddie Mc) utilized at visit. Patient states she had U?S appointment this a.m. Copy of BS Log obtained. Delynn Flavin RN

## 2021-10-30 ENCOUNTER — Ambulatory Visit: Payer: Self-pay | Admitting: Advanced Practice Midwife

## 2021-10-30 VITALS — BP 99/59 | HR 85 | Temp 97.5°F | Wt 164.4 lb

## 2021-10-30 DIAGNOSIS — O24419 Gestational diabetes mellitus in pregnancy, unspecified control: Secondary | ICD-10-CM

## 2021-10-30 DIAGNOSIS — Z3480 Encounter for supervision of other normal pregnancy, unspecified trimester: Secondary | ICD-10-CM

## 2021-10-30 LAB — URINALYSIS
Bilirubin, UA: NEGATIVE
Glucose, UA: NEGATIVE
Ketones, UA: NEGATIVE
Leukocytes,UA: NEGATIVE
Nitrite, UA: NEGATIVE
Protein,UA: NEGATIVE
RBC, UA: NEGATIVE
Specific Gravity, UA: 1.015 (ref 1.005–1.030)
Urobilinogen, Ur: 0.2 mg/dL (ref 0.2–1.0)
pH, UA: 7 (ref 5.0–7.5)

## 2021-10-30 NOTE — Progress Notes (Signed)
[facility]  PRENATAL VISIT NOTE  Subjective:  Samantha Brennan is a 26 y.o. G2P1001 at [redacted]w[redacted]d being seen today for ongoing prenatal care.  She is currently monitored for the following issues for this high-risk pregnancy and has Supervision of other normal pregnancy, antepartum and Gestational diabetes mellitus (GDM) dx'd 10/02/21 on their problem list.  Patient reports no complaints.  Contractions: Not present. Vag. Bleeding: None.  Movement: Present. Denies leaking of fluid/ROM.   The following portions of the patient's history were reviewed and updated as appropriate: allergies, current medications, past family history, past medical history, past social history, past surgical history and problem list. Problem list updated.  Objective:   Vitals:   10/30/21 0816 10/30/21 0830  BP: (!) 99/59   Pulse: 85   Temp: (!) 97.5 F (36.4 C)   Weight: 164 lb 9.6 oz (74.7 kg) 164 lb 6 oz (74.6 kg)    Fetal Status: Fetal Heart Rate (bpm): 130 Fundal Height: 31 cm Movement: Present     General:  Alert, oriented and cooperative. Patient is in no acute distress.  Skin: Skin is warm and dry. No rash noted.   Cardiovascular: Normal heart rate noted  Respiratory: Normal respiratory effort, no problems with respiration noted  Abdomen: Soft, gravid, appropriate for gestational age.  Pain/Pressure: Absent     Pelvic: Cervical exam deferred        Extremities: Normal range of motion.  Edema: None  Mental Status: Normal mood and affect. Normal behavior. Normal judgment and thought content.   Assessment and Plan:  Pregnancy: G2P1001 at [redacted]w[redacted]d  1. Supervision of other normal pregnancy, antepartum 9 lb 6 oz (4.252 kg) Not working Living with 4 friends and pts 2 yo son; states son's father financially supports son and she has enough food at home Reviewed 10/23/21 u/s at 30 6/7 with EFW=23%, posterior placenta, AFI wnl Has f/u growth u/s 11/27/21  2.  Gestational diabetes mellitus (GDM) affecting third pregnancy Blood sugar log values below. Encouraged continued daily exercise and diet modifications. 3 of  7 FBS values are abnormal (range was 88 to 96) 4 of 21 2hr pp values are abnormal (range was 83 to 123) Exercise: walks 30. minutes 5x/wk Diet recall :   Breakfast = nothing today                      Lunch = yesterday: salsa, beans, beef, water                      Dinner = yesterday: nothing                      Snack = no snacks Water: drinks about  4 -5 bottles of water/day and night  - Urinalysis (Urine Dip) Lost 1 lb in last week Only kept 1 [employer] apt on 10/17/21; counseled pt if diet is not proper diabetic diet and weight loss continues, will need to have second [employer] apt  Preterm labor symptoms and general obstetric precautions including but not limited to vaginal bleeding, contractions, leaking of fluid and fetal movement were reviewed in detail with the patient. Please refer to After Visit Summary for other counseling recommendations.  Return in about 1 week (around 11/06/2021) for routine PNC.  Future Appointments  Date Time Provider Department Center  11/27/2021  9:00 AM [facility]    Alberteen Spindle, CNM

## 2021-10-30 NOTE — Progress Notes (Signed)
Pt here for MH RV at [redacted]w[redacted]d.   Urine dip reviewed during clinic visit - no treatment indicated. Patient brought in copy of blood sugar log.   Upcoming appt scheduled in advance as patient is gest diabetic and needs weekly appt.   Earlyne Iba, RN

## 2021-11-06 ENCOUNTER — Ambulatory Visit: Payer: Self-pay

## 2021-11-13 ENCOUNTER — Ambulatory Visit: Payer: Self-pay | Admitting: Physician Assistant

## 2021-11-13 ENCOUNTER — Encounter: Payer: Self-pay | Admitting: Physician Assistant

## 2021-11-13 VITALS — BP 98/56 | HR 87 | Temp 97.1°F | Wt 164.0 lb

## 2021-11-13 DIAGNOSIS — O24419 Gestational diabetes mellitus in pregnancy, unspecified control: Secondary | ICD-10-CM

## 2021-11-13 DIAGNOSIS — Z3480 Encounter for supervision of other normal pregnancy, unspecified trimester: Secondary | ICD-10-CM

## 2021-11-13 LAB — URINALYSIS
Bilirubin, UA: NEGATIVE
Glucose, UA: NEGATIVE
Ketones, UA: NEGATIVE
Leukocytes,UA: NEGATIVE
Nitrite, UA: NEGATIVE
Protein,UA: NEGATIVE
RBC, UA: NEGATIVE
Specific Gravity, UA: 1.01 (ref 1.005–1.030)
Urobilinogen, Ur: 0.2 mg/dL (ref 0.2–1.0)
pH, UA: 7 (ref 5.0–7.5)

## 2021-11-13 NOTE — Progress Notes (Signed)
[facility]  PRENATAL VISIT NOTE  Subjective:  Samantha Brennan is a 26 y.o. G2P1001 at [redacted]w[redacted]d being seen today for ongoing prenatal care.  She reports she is moving to [place] this weekend and has scheduled an initial prenatal visit with a new provider next week on 11/21/21. She is currently monitored for the following issues for this high-risk pregnancy and has Supervision of other normal pregnancy, antepartum and Gestational diabetes mellitus (GDM) dx'd 10/02/21 on their problem list.  Patient reports no complaints.  Contractions: Not present. Vag. Bleeding: None.  Movement: Present. Denies leaking of fluid/ROM.   The following portions of the patient's history were reviewed and updated as appropriate: allergies, current medications, past family history, past medical history, past social history, past surgical history and problem list. Problem list updated.  Objective:   Vitals:   11/13/21 0833  BP: (!) 98/56  Pulse: 87  Temp: (!) 97.1 F (36.2 C)  Weight: 164 lb (74.4 kg)    Fetal Status: Fetal Heart Rate (bpm): 120 Fundal Height: 33 cm Movement: Present     General:  Alert, oriented and cooperative. Patient is in no acute distress.  Skin: Skin is warm and dry. No rash noted.   Cardiovascular: Normal heart rate noted  Respiratory: Normal respiratory effort, no problems with respiration noted  Abdomen: Soft, gravid, appropriate for gestational age.  Pain/Pressure: Absent     Pelvic: Cervical exam deferred        Extremities: Normal range of motion.  Edema: None  Mental Status: Normal mood and affect. Normal behavior. Normal judgment and thought content.   Assessment and Plan:  Pregnancy: G2P1001 at [redacted]w[redacted]d  1. Supervision of other normal pregnancy, antepartum Doing well. Copy of prenatal record given to patient to take to new prenatal provider in [place] for transfer of care/first visit next week. Enc water drinking in light of  very hot weather.  2. Gestational diabetes mellitus (GDM) affecting third pregnancy Home BS log over last 2 weeks reviewed: FBS 88-106 (<50% >95 goal), 2h pp 88-123 (<50% >120 goal). Urine glu neg today. Good control on diet/exercise; no indication for addition of medication. Enc to continue diet/exercise. Had planned fetal growth [place] 11/27/21, but defer to new prenatal provider. - Urinalysis (Urine Dip)  Preterm labor symptoms and general obstetric precautions including but not limited to vaginal bleeding, contractions, leaking of fluid and fetal movement were reviewed in detail with the patient. Please refer to After Visit Summary for other counseling recommendations.  Due to language barrier, an interpreter (M. [place]) was present during the history-taking and subsequent discussion (and for part of the physical exam) with this patient.  Return in about 1 week (around 11/20/2021) for Routine prenatal care with new prenatal provider in [place].  Future Appointments  Date Time Provider Department Center  11/21/2021  8:20 AM [facility] PROVIDER [facility] None  11/27/2021  1:00 PM [facility]  11/27/2021  1:20 PM [facility] PROVIDER [facility] None  12/04/2021  8:20 AM [facility] PROVIDER [facility] None  12/11/2021  8:20 AM [facility] PROVIDER [facility] None  12/18/2021  8:20 AM [facility] PROVIDER [facility] None    Landry Dyke, PA-C

## 2021-11-13 NOTE — Progress Notes (Signed)
Patient here for MH RV at 34w 1d.   Urine dip reviewed during clinic visit. Blood sugar log copy made and handed to provider.   Patient informed us that she is moving to [place] this weekend due to work and family being there. ROI signed by patient to release information to herself so she can take to her maternity visit in [place] on 11/21/21. All pregnancy records and ultrasound printed and handed to patient. Future appts cancelled.   Earlyne Iba, RN

## 2021-11-21 ENCOUNTER — Ambulatory Visit: Payer: Self-pay

## 2021-11-27 ENCOUNTER — Ambulatory Visit: Payer: Self-pay

## 2021-12-04 ENCOUNTER — Ambulatory Visit: Payer: Self-pay

## 2021-12-11 ENCOUNTER — Ambulatory Visit: Payer: Self-pay

## 2021-12-18 ENCOUNTER — Ambulatory Visit: Payer: Self-pay

## 2023-05-03 IMAGING — US US MFM OB COMP +14 WKS
1 series · 13 of 28 positions shown · non-contrast
Comparison: none

[Series 1: us mfm ob comp +14 wks · 13 of 102 slices shown]
[im 4/102]
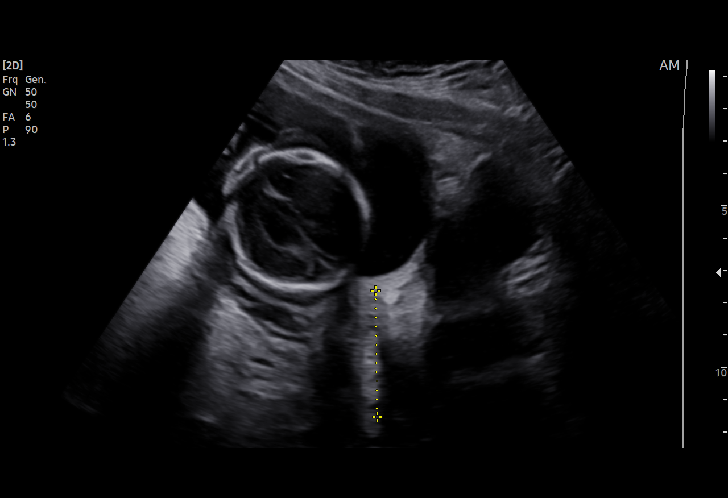
[im 12/102]
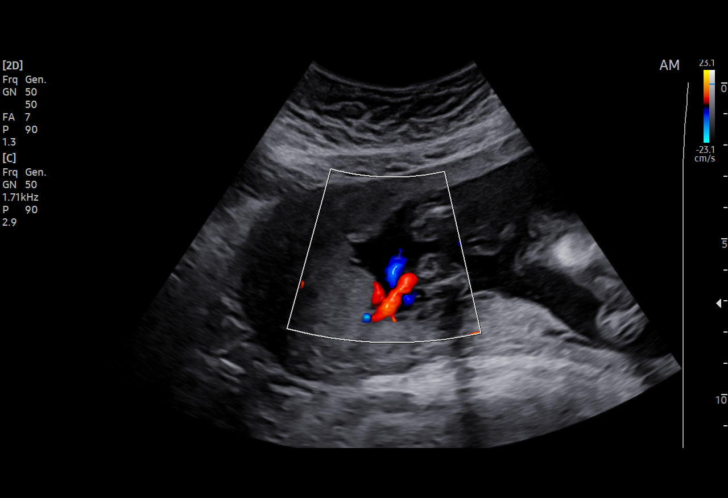
[im 19/102]
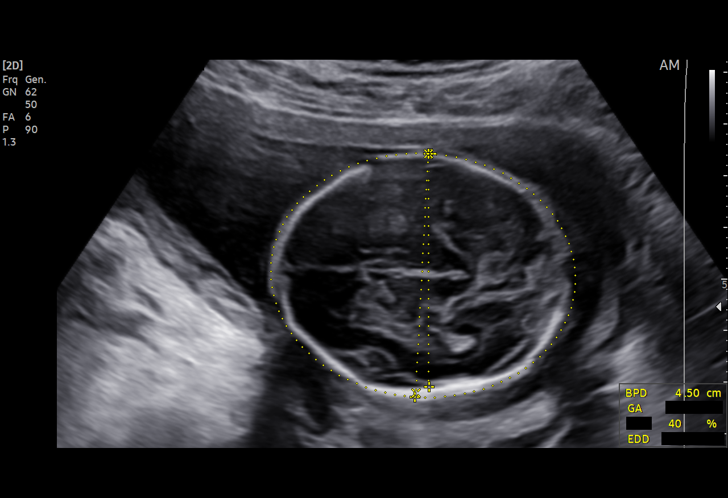
[im 27/102]
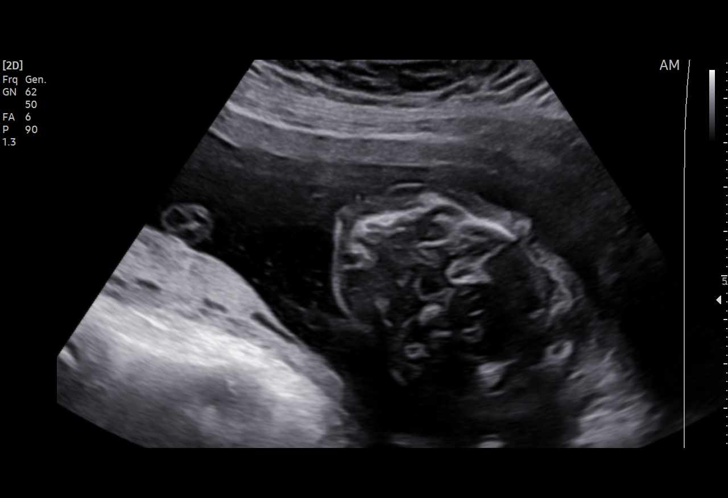
[im 34/102]
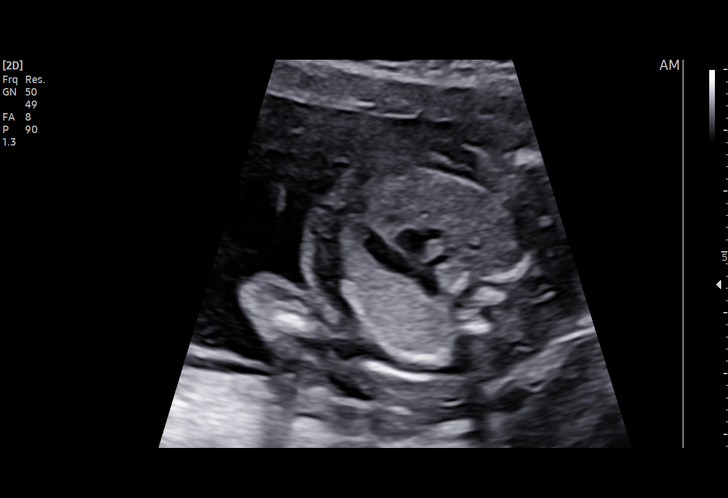
[im 42/102]
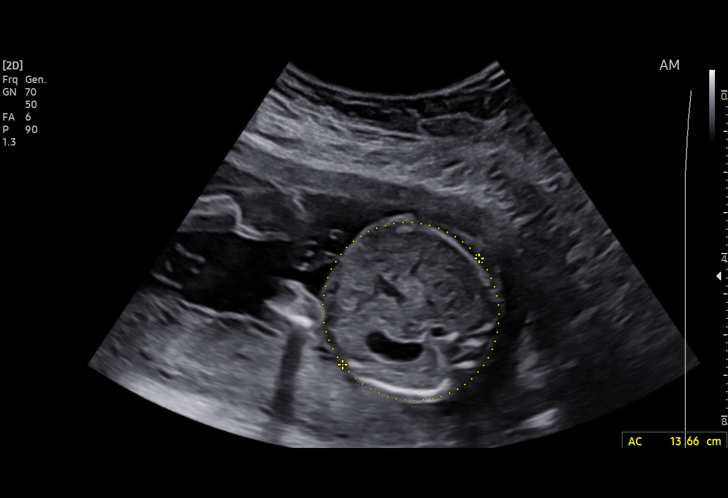
[im 53/102]
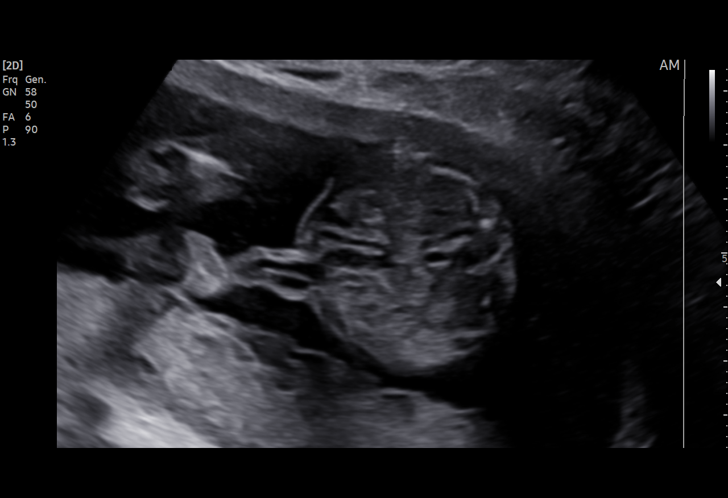
[im 60/102]
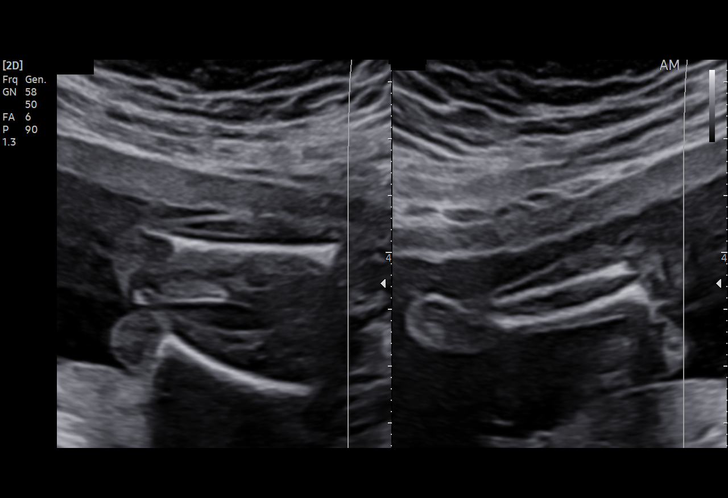
[im 68/102]
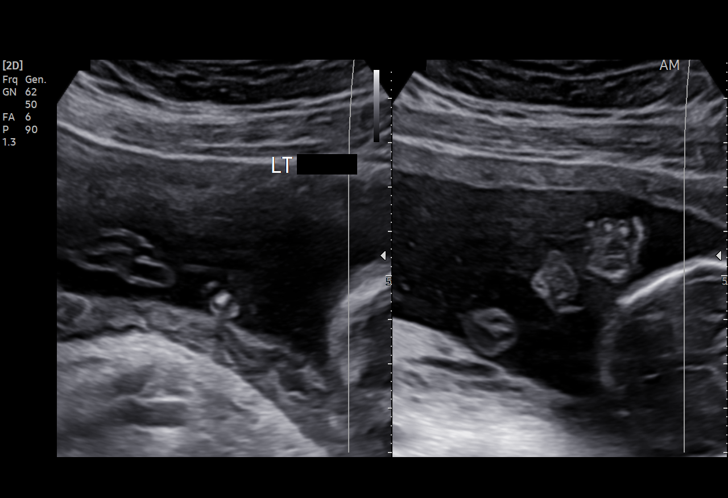
[im 75/102]
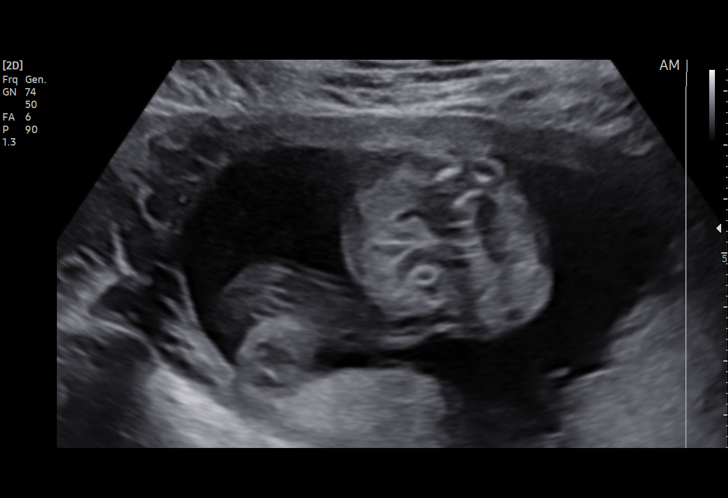
[im 83/102]
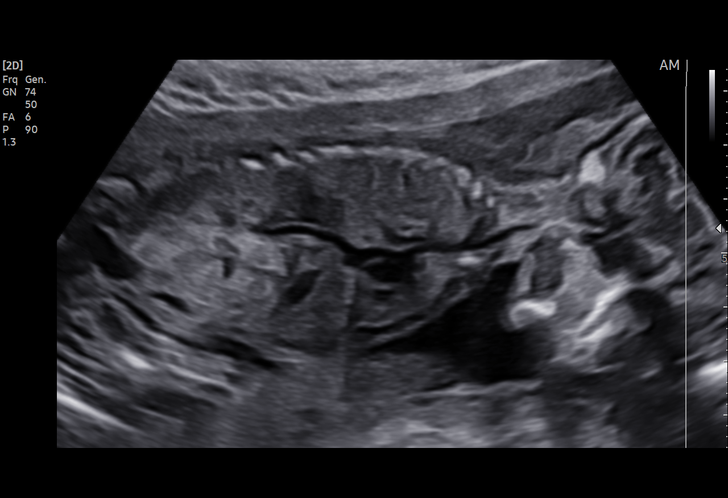
[im 90/102]
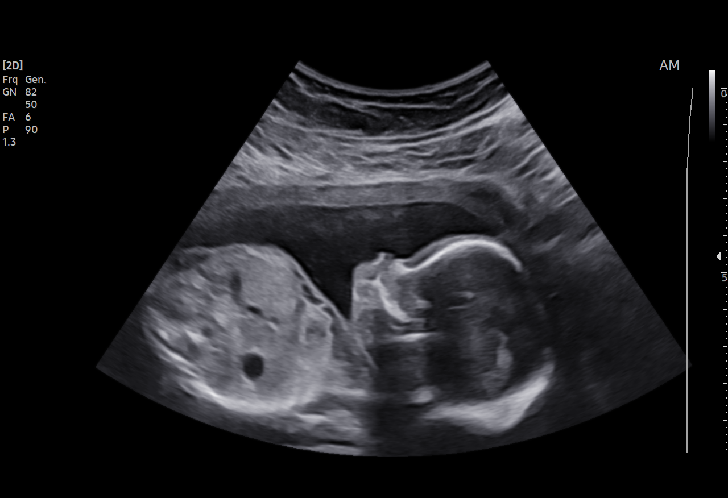
[im 98/102]
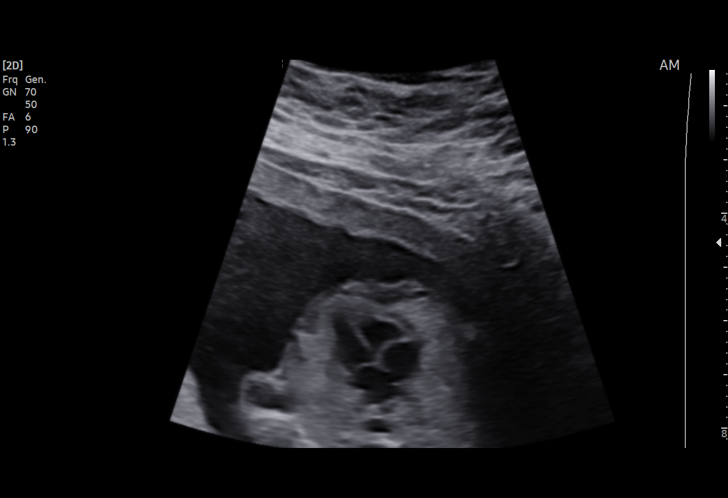

[13 of 28 positions shown; findings below may reference images not displayed]

[facility]

                                                            [address]
                   [facility]
                   Department

 1  US MFM OB COMP + 14 WK                [address].01    CHIPO RITAH LARCOU

Indications

 19 weeks gestation of pregnancy
 Antenatal screening for malformations
 LR NIPS/AFP-Neg
Fetal Evaluation

 Num Of Fetuses:         1
 Fetal Heart Rate(bpm):  160
 Cardiac Activity:       Observed
 Presentation:           Cephalic
 Placenta:               Posterior
 P. Cord Insertion:      Visualized

 Amniotic Fluid
 AFI FV:      Within normal limits
Biometry

 BPD:      46.1  mm     G. Age:  20w 0d         53  %    CI:        76.17   %    70 - 86
                                                         FL/HC:      17.6   %    16.8 -
 HC:      167.4  mm     G. Age:  19w 3d         22  %    HC/AC:      1.21        1.09 -
 AC:      138.9  mm     G. Age:  19w 2d         26  %    FL/BPD:     63.8   %
 FL:       29.4  mm     G. Age:  19w 0d         17  %    FL/AC:      21.2   %    20 - 24
 CER:      19.3  mm     G. Age:  18w 6d         33  %
 NFT:       3.0  mm

 LV:        5.5  mm
 CM:          3  mm
 Est. FW:     281  gm    0 lb 10 oz      16  %
OB History

 Maternal Racial/Ethnic Group:   `
 Gravidity:    2         Term:   1        Prem:   0        SAB:   0
 TOP:          0       Ectopic:  0        Living: 1
Gestational Age

 LMP:           19w 6d        Date:  03/19/21                 EDD:   12/24/21
 U/S Today:     19w 3d                                        EDD:   12/27/21
 Best:          19w 6d     Det. By:  LMP  (03/19/21)          EDD:   12/24/21
Anatomy

 Cranium:               Appears normal         Aortic Arch:            Appears normal
 Cavum:                 Appears normal         Ductal Arch:            Appears normal
 Ventricles:            Appears normal         Diaphragm:              Appears normal
 Choroid Plexus:        Appears normal         Stomach:                Appears normal, left
                                                                       sided
 Cerebellum:            Appears normal         Abdomen:                Appears normal
 Posterior Fossa:       Appears normal         Abdominal Wall:         Appears nml (cord
                                                                       insert, abd wall)
 Nuchal Fold:           Appears normal         Cord Vessels:           Appears normal (3
                                                                       vessel cord)
 Face:                  Appears normal         Kidneys:                Appear normal
                        (orbits and profile)
 Lips:                  Appears normal         Bladder:                Appears normal
 Thoracic:              Appears normal         Spine:                  Appears normal
 Heart:                 Appears normal         Upper Extremities:      Appears normal
                        (4CH, axis, and
                        situs)
 RVOT:                  Appears normal         Lower Extremities:      Appears normal
 LVOT:                  Appears normal

 Other:  VC, 3VV and 3VTV visualized. Nasal bone, lenses, maxilla, mandible
         and falx visualized. Hands and feet visualized. Fetus appears to be
         female.
Cervix Uterus Adnexa

 Cervix
 Length:           3.52  cm.
 Normal appearance by transabdominal scan.

 Uterus
 No abnormality visualized.

 Right Ovary
 Size(cm)     3.17   x   2.5    x  1.71      Vol(ml):
 Not visualized.

 Left Ovary
 Size(cm)       3.4  x   2.28   x  1.48      Vol(ml):
 Not visualized.
Impression

 G2 P1.  Patient is here for fetal anatomy scan.
 On cell-free fetal DNA screening, the risks of fetal
 aneuploidies are not increased .MSAFP screening showed
 low risk for open-neural tube defects .

 Obstetric history significant for a term vaginal delivery.

 We performed fetal anatomy scan. No makers of
 aneuploidies or fetal structural defects are seen. Fetal
 biometry is consistent with her previously-established dates.
 Amniotic fluid is normal and good fetal activity is seen.
 Patient understands the limitations of ultrasound in detecting
 fetal anomalies.
Recommendations

 -An appointment was made for her to return in 8 weeks for
 fetal growth assessment.
                 Catlin, Demond
